# Patient Record
Sex: Male | Born: 2000 | Hispanic: Yes | Marital: Single
Health system: Southern US, Community
[De-identification: ages and names within clinical notes are randomized; demographics above are authoritative.]

---
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
## 2021-04-05 IMAGING — DX DG FOREARM 2V*L*
2 series · 2 of 2 positions shown · non-contrast
Comparison: None.

CLINICAL DATA: Forearm injury

EXAM:
LEFT FOREARM - 2 VIEW

[forearm ap]
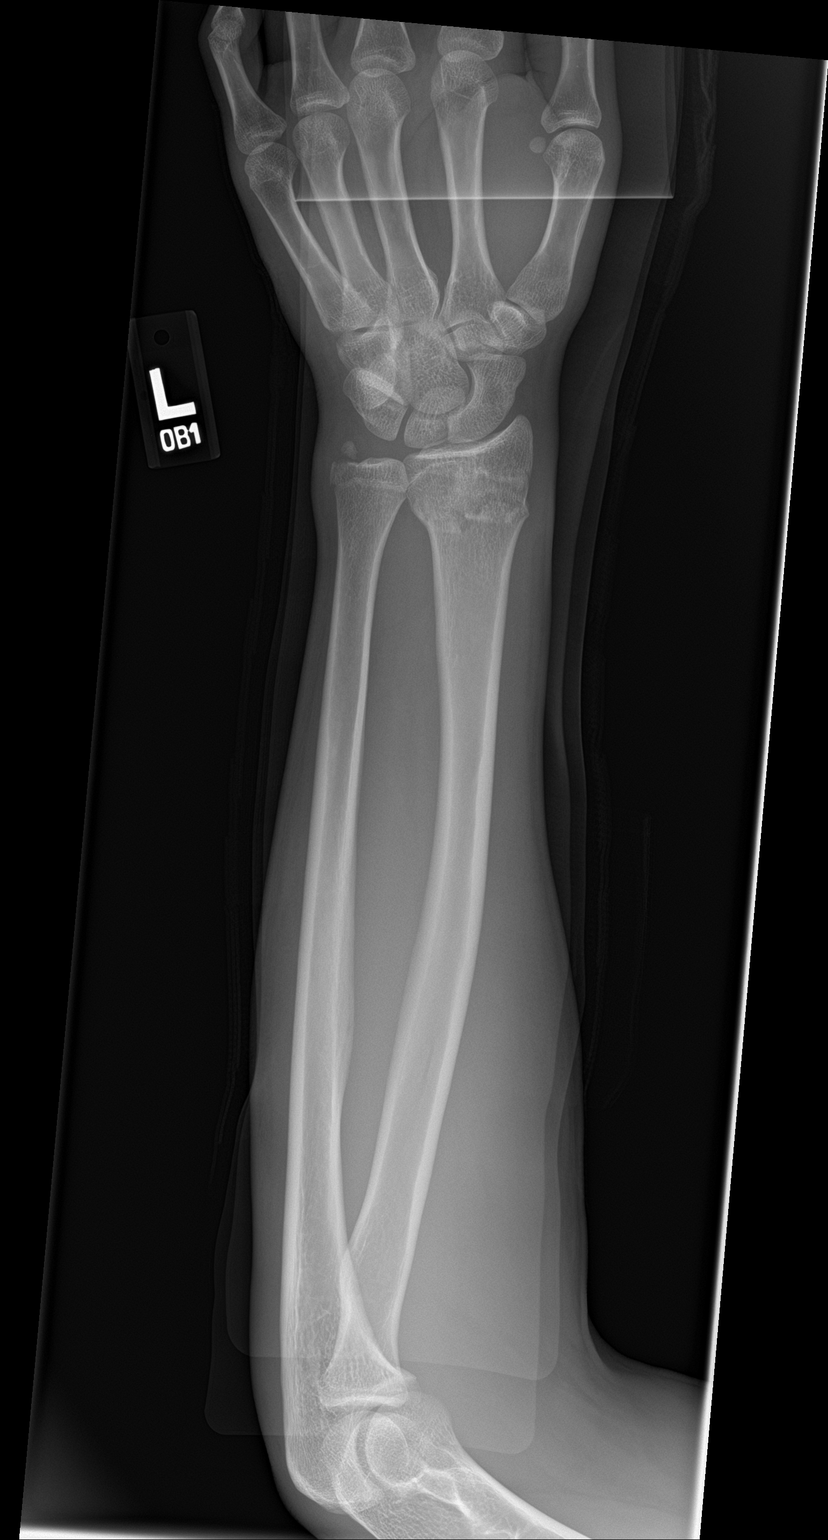

[forearm lat]
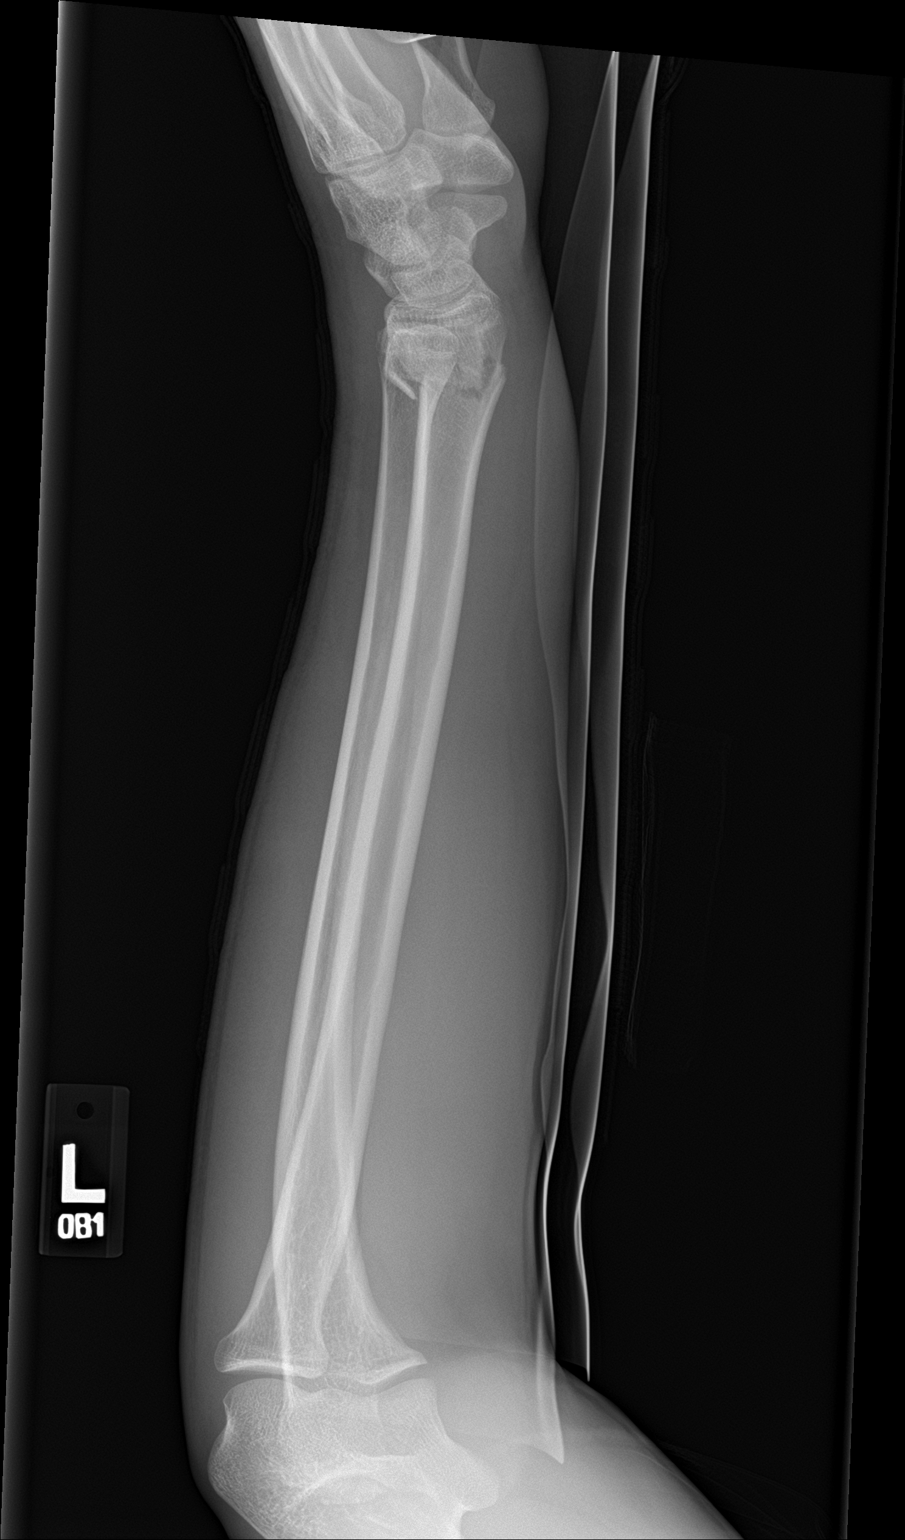

[2 of 2 positions shown; findings below may reference images not displayed]

FINDINGS: Acute displaced ulnar styloid process fracture. Acute impacted
fracture involving the distal metaphysis of the radius with mild
dorsal angulation of distal fracture fragment.
IMPRESSION: Acute mildly angulated distal radius fracture. Acute displaced ulnar
styloid process fracture.
